# Patient Record
Sex: Male | Born: 1960 | Race: Black or African American | Hispanic: No | Marital: Single | Smoking: Never smoker
Health system: Southern US, Community
[De-identification: ages and names within clinical notes are randomized; demographics above are authoritative.]

## PROBLEM LIST (undated history)

## (undated) DIAGNOSIS — I10 Essential (primary) hypertension: Secondary | ICD-10-CM

## (undated) DIAGNOSIS — M1990 Unspecified osteoarthritis, unspecified site: Secondary | ICD-10-CM

---

## 2019-08-05 ENCOUNTER — Other Ambulatory Visit: Payer: Self-pay

## 2019-08-05 ENCOUNTER — Encounter (HOSPITAL_COMMUNITY): Payer: Self-pay

## 2019-08-05 ENCOUNTER — Emergency Department (HOSPITAL_COMMUNITY): Payer: No Typology Code available for payment source

## 2019-08-05 ENCOUNTER — Emergency Department (HOSPITAL_COMMUNITY)
Admission: EM | Admit: 2019-08-05 | Discharge: 2019-08-05 | Disposition: A | Discharge status: Home / Self Care | Payer: No Typology Code available for payment source | Attending: Emergency Medicine | Admitting: Emergency Medicine

## 2019-08-05 DIAGNOSIS — Y9389 Activity, other specified: Secondary | ICD-10-CM | POA: Diagnosis not present

## 2019-08-05 DIAGNOSIS — S199XXA Unspecified injury of neck, initial encounter: Secondary | ICD-10-CM | POA: Diagnosis present

## 2019-08-05 DIAGNOSIS — Y92410 Unspecified street and highway as the place of occurrence of the external cause: Secondary | ICD-10-CM | POA: Insufficient documentation

## 2019-08-05 DIAGNOSIS — I10 Essential (primary) hypertension: Secondary | ICD-10-CM | POA: Insufficient documentation

## 2019-08-05 DIAGNOSIS — S161XXA Strain of muscle, fascia and tendon at neck level, initial encounter: Secondary | ICD-10-CM

## 2019-08-05 DIAGNOSIS — Y999 Unspecified external cause status: Secondary | ICD-10-CM | POA: Insufficient documentation

## 2019-08-05 HISTORY — DX: Essential (primary) hypertension: I10

## 2019-08-05 HISTORY — DX: Unspecified osteoarthritis, unspecified site: M19.90

## 2019-08-05 MED ORDER — ACETAMINOPHEN 500 MG PO TABS: 1000.0000 mg | ORAL_TABLET | Freq: Once | ORAL | Status: DC
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
## 2019-08-05 NOTE — Discharge Instructions (Signed)
Please return for any problem.  Follow-up with your regular care provider as instructed. °

## 2019-11-06 ENCOUNTER — Ambulatory Visit: Payer: Self-pay | Admitting: Cardiology
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
## 2019-12-16 ENCOUNTER — Other Ambulatory Visit: Payer: Self-pay | Admitting: *Deleted

## 2019-12-21 ENCOUNTER — Other Ambulatory Visit: Payer: Self-pay

## 2019-12-21 ENCOUNTER — Encounter: Payer: Self-pay | Admitting: Cardiology

## 2019-12-21 ENCOUNTER — Ambulatory Visit (INDEPENDENT_AMBULATORY_CARE_PROVIDER_SITE_OTHER): Payer: BC Managed Care – PPO | Admitting: Cardiology

## 2019-12-21 DIAGNOSIS — R0683 Snoring: Secondary | ICD-10-CM | POA: Insufficient documentation

## 2019-12-21 DIAGNOSIS — R0600 Dyspnea, unspecified: Secondary | ICD-10-CM

## 2019-12-21 DIAGNOSIS — I10 Essential (primary) hypertension: Secondary | ICD-10-CM | POA: Diagnosis not present

## 2019-12-21 DIAGNOSIS — R0609 Other forms of dyspnea: Secondary | ICD-10-CM

## 2019-12-21 DIAGNOSIS — I447 Left bundle-branch block, unspecified: Secondary | ICD-10-CM

## 2019-12-21 NOTE — Progress Notes (Signed)
Cardiology Consultation:    Date:  12/21/2019   ID:  Barry Keller, DOB 02-Jul-1961, MRN [id number]  PCP:  Maylon Cos, NP  Cardiologist:  Jenne Campus, MD   Referring MD: Maylon Cos, NP   Chief Complaint  Patient presents with  . New Patient (Initial Visit)  I am not sure why I am here  History of Present Illness:    Barry Keller is a 59 y.o. male who is being seen today for the evaluation of left bundle branch block at the request of Maylon Cos, NP.  He does have past medical history significant for morbid obesity, essential hypertension.  Years ago he had some investigation done for some heart problem but he does not know exactly what was the issue he was referred to [place] because routine EKG showed left bundle branch block.  He denies having any shortness no chest pain tightness squeezing pressure burning chest he still work physically he described to have some shortness of breath with exertion.  No palpitations no dizziness no passing out.  He does not smoke he does have high blood pressure which appears to be still poorly controlled.  He does not have any way to check his blood pressure at home.  He does not remember what medication he takes and I do not have a list of this medication.  He remember taking metoprolol and some other water pill.  Past Medical History:  Diagnosis Date  . Arthritis   . Hypertension     History reviewed. No pertinent surgical history.  Current Medications: Current Meds  Medication Sig  . lisinopril (ZESTRIL) 40 MG tablet Take 40 mg by mouth daily.  . metoprolol tartrate (LOPRESSOR) 100 MG tablet Take 100 mg by mouth daily.     Allergies:   Other and Sulfa antibiotics   Social History   Socioeconomic History  . Marital status: Single    Spouse name: Not on file  . Number of children: Not on file  . Years of education: Not on file  . Highest education level: Not on file  Occupational History  . Not on file  Tobacco  Use  . Smoking status: Never Smoker  . Smokeless tobacco: Never Used  Substance and Sexual Activity  . Alcohol use: Never  . Drug use: Never  . Sexual activity: Not on file  Other Topics Concern  . Not on file  Social History Narrative  . Not on file   Social Determinants of Health   Financial Resource Strain:   . Difficulty of Paying Living Expenses: Not on file  Food Insecurity:   . Worried About Charity fundraiser in the Last Year: Not on file  . Ran Out of Food in the Last Year: Not on file  Transportation Needs:   . Lack of Transportation (Medical): Not on file  . Lack of Transportation (Non-Medical): Not on file  Physical Activity:   . Days of Exercise per Week: Not on file  . Minutes of Exercise per Session: Not on file  Stress:   . Feeling of Stress : Not on file  Social Connections:   . Frequency of Communication with Friends and Family: Not on file  . Frequency of Social Gatherings with Friends and Family: Not on file  . Attends Religious Services: Not on file  . Active Member of Clubs or Organizations: Not on file  . Attends Archivist Meetings: Not on file  . Marital Status: Not on file  Family History: The patient's family history includes Chronic Renal Failure in his mother; Hypertension in his mother. ROS:   Please see the history of present illness.    All 14 point review of systems negative except as described per history of present illness.  EKGs/Labs/Other Studies Reviewed:    The following studies were reviewed today: Normal sinus rhythm, normal P interval, left bundle branch block.    Recent Labs: No results found for requested labs within last 8760 hours.  Recent Lipid Panel No results found for: CHOL, TRIG, HDL, CHOLHDL, VLDL, LDLCALC, LDLDIRECT  Physical Exam:    VS:  BP (!) 188/110   Pulse 69   Ht 6' (1.829 m)   Wt (!) 314 lb (142.4 kg)   SpO2 95%   BMI 42.59 kg/m     Wt Readings from Last 3 Encounters:  12/21/19  (!) 314 lb (142.4 kg)  08/05/19 300 lb (136.1 kg)     GEN:  Well nourished, well developed in no acute distress HEENT: Normal NECK: No JVD; No carotid bruits LYMPHATICS: No lymphadenopathy CARDIAC: RRR, no murmurs, no rubs, no gallops RESPIRATORY:  Clear to auscultation without rales, wheezing or rhonchi  ABDOMEN: Soft, non-tender, non-distended MUSCULOSKELETAL:  No edema; No deformity  SKIN: Warm and dry NEUROLOGIC:  Alert and oriented x 3 PSYCHIATRIC:  Normal affect   ASSESSMENT:    1. Left bundle branch block   2. Essential hypertension   3. Dyspnea on exertion   4. Snoring    PLAN:    In order of problems listed above:  1. Left bundle branch block.  Incidental discovery.  As a part of evaluation I will schedule him to have an echocardiogram done to assess left ventricle ejection fraction.  Also Eugenie Birks will be done to rule out any significant ischemia. 2. Essential hypertension blood pressure elevated today.  But this is the first visit in our office on top of that he does not remember what medication he takes.  I asked him to call our office later today and tell us what medication he takes.  Also advised him to get blood pressure monitor and check blood pressure on the regular basis. 3. Dyspnea on exertion stress test and echocardiogram will be done 4. Snoring I suspect he does have sleep apnea however he tells me that many years ago he was tested for it and he was negative. 5. Obesity we had a discussion about this as well.  Will be beneficial for him to lose some weight.   Medication Adjustments/Labs and Tests Ordered: Current medicines are reviewed at length with the patient today.  Concerns regarding medicines are outlined above.  No orders of the defined types were placed in this encounter.  No orders of the defined types were placed in this encounter.   Signed, Georgeanna Lea, MD, [facility]. 12/21/2019 12:06 PM    [facility]

## 2019-12-21 NOTE — Addendum Note (Signed)
Addended by: Vanessa Zeeland R on: 12/21/2019 01:10 PM   Modules accepted: Orders

## 2019-12-21 NOTE — Patient Instructions (Signed)
Medication Instructions:  Your physician recommends that you continue on your current medications as directed. Please refer to the Current Medication list given to you today.  *If you need a refill on your cardiac medications before your next appointment, please call your pharmacy*  Lab Work: None  If you have labs (blood work) drawn today and your tests are completely normal, you will receive your results only by: . MyChart Message (if you have MyChart) OR . A paper copy in the mail If you have any lab test that is abnormal or we need to change your treatment, we will call you to review the results.  Testing/Procedures: Your physician has requested that you have an echocardiogram. Echocardiography is a painless test that uses sound waves to create images of your heart. It provides your doctor with information about the size and shape of your heart and how well your heart's chambers and valves are working. This procedure takes approximately one hour. There are no restrictions for this procedure.  Your physician has requested that you have a lexiscan myoview. For further information please visit [email or web address]. Please follow instruction sheet, as given.    Follow-Up: At [facility], you and your health needs are our priority.  As part of our continuing mission to provide you with exceptional heart care, we have created designated Provider Care Teams.  These Care Teams include your primary Cardiologist (physician) and Advanced Practice Providers (APPs -  Physician Assistants and Nurse Practitioners) who all work together to provide you with the care you need, when you need it.  Your next appointment:   6 week(s)  The format for your next appointment:   In Person  Provider:   Robert Krasowski, MD  Other Instructions   Echocardiogram An echocardiogram is a procedure that uses painless sound waves (ultrasound) to produce an image of the heart. Images from an echocardiogram can  provide important information about:  Signs of coronary artery disease (CAD).  Aneurysm detection. An aneurysm is a weak or damaged part of an artery wall that bulges out from the normal force of blood pumping through the body.  Heart size and shape. Changes in the size or shape of the heart can be associated with certain conditions, including heart failure, aneurysm, and CAD.  Heart muscle function.  Heart valve function.  Signs of a past heart attack.  Fluid buildup around the heart.  Thickening of the heart muscle.  A tumor or infectious growth around the heart valves. Tell a health care provider about:  Any allergies you have.  All medicines you are taking, including vitamins, herbs, eye drops, creams, and over-the-counter medicines.  Any blood disorders you have.  Any surgeries you have had.  Any medical conditions you have.  Whether you are pregnant or may be pregnant. What are the risks? Generally, this is a safe procedure. However, problems may occur, including:  Allergic reaction to dye (contrast) that may be used during the procedure. What happens before the procedure? No specific preparation is needed. You may eat and drink normally. What happens during the procedure?   An IV tube may be inserted into one of your veins.  You may receive contrast through this tube. A contrast is an injection that improves the quality of the pictures from your heart.  A gel will be applied to your chest.  A wand-like tool (transducer) will be moved over your chest. The gel will help to transmit the sound waves from the transducer.  The sound waves   will harmlessly bounce off of your heart to allow the heart images to be captured in real-time motion. The images will be recorded on a computer. The procedure may vary among health care providers and hospitals. What happens after the procedure?  You may return to your normal, everyday life, including diet, activities, and  medicines, unless your health care provider tells you not to do that. Summary  An echocardiogram is a procedure that uses painless sound waves (ultrasound) to produce an image of the heart.  Images from an echocardiogram can provide important information about the size and shape of your heart, heart muscle function, heart valve function, and fluid buildup around your heart.  You do not need to do anything to prepare before this procedure. You may eat and drink normally.  After the echocardiogram is completed, you may return to your normal, everyday life, unless your health care provider tells you not to do that. This information is not intended to replace advice given to you by your health care provider. Make sure you discuss any questions you have with your health care provider. Document Revised: 03/26/2019 Document Reviewed: 01/05/2017 Elsevier Patient Education  2020 Elsevier Inc.   Cardiac Nuclear Scan A cardiac nuclear scan is a test that measures blood flow to the heart when a person is resting and when he or she is exercising. The test looks for problems such as:  Not enough blood reaching a portion of the heart.  The heart muscle not working normally. You may need this test if:  You have heart disease.  You have had abnormal lab results.  You have had heart surgery or a balloon procedure to open up blocked arteries (angioplasty).  You have chest pain.  You have shortness of breath. In this test, a radioactive dye (tracer) is injected into your bloodstream. After the tracer has traveled to your heart, an imaging device is used to measure how much of the tracer is absorbed by or distributed to various areas of your heart. This procedure is usually done at a hospital and takes 2-4 hours. Tell a health care provider about:  Any allergies you have.  All medicines you are taking, including vitamins, herbs, eye drops, creams, and over-the-counter medicines.  Any problems you  or family members have had with anesthetic medicines.  Any blood disorders you have.  Any surgeries you have had.  Any medical conditions you have.  Whether you are pregnant or may be pregnant. What are the risks? Generally, this is a safe procedure. However, problems may occur, including:  Serious chest pain and heart attack. This is only a risk if the stress portion of the test is done.  Rapid heartbeat.  Sensation of warmth in your chest. This usually passes quickly.  Allergic reaction to the tracer. What happens before the procedure?  Ask your health care provider about changing or stopping your regular medicines. This is especially important if you are taking diabetes medicines or blood thinners.  Follow instructions from your health care provider about eating or drinking restrictions.  Remove your jewelry on the day of the procedure. What happens during the procedure?  An IV will be inserted into one of your veins.  Your health care provider will inject a small amount of radioactive tracer through the IV.  You will wait for 20-40 minutes while the tracer travels through your bloodstream.  Your heart activity will be monitored with an electrocardiogram (ECG).  You will lie down on an exam table.    Images of your heart will be taken for about 15-20 minutes.  You may also have a stress test. For this test, one of the following may be done: ? You will exercise on a treadmill or stationary bike. While you exercise, your heart's activity will be monitored with an ECG, and your blood pressure will be checked. ? You will be given medicines that will increase blood flow to parts of your heart. This is done if you are unable to exercise.  When blood flow to your heart has peaked, a tracer will again be injected through the IV.  After 20-40 minutes, you will get back on the exam table and have more images taken of your heart.  Depending on the type of tracer used, scans may  need to be repeated 3-4 hours later.  Your IV line will be removed when the procedure is over. The procedure may vary among health care providers and hospitals. What happens after the procedure?  Unless your health care provider tells you otherwise, you may return to your normal schedule, including diet, activities, and medicines.  Unless your health care provider tells you otherwise, you may increase your fluid intake. This will help to flush the contrast dye from your body. Drink enough fluid to keep your urine pale yellow.  Ask your health care provider, or the department that is doing the test: ? When will my results be ready? ? How will I get my results? Summary  A cardiac nuclear scan measures the blood flow to the heart when a person is resting and when he or she is exercising.  Tell your health care provider if you are pregnant.  Before the procedure, ask your health care provider about changing or stopping your regular medicines. This is especially important if you are taking diabetes medicines or blood thinners.  After the procedure, unless your health care provider tells you otherwise, increase your fluid intake. This will help flush the contrast dye from your body.  After the procedure, unless your health care provider tells you otherwise, you may return to your normal schedule, including diet, activities, and medicines. This information is not intended to replace advice given to you by your health care provider. Make sure you discuss any questions you have with your health care provider. Document Revised: 05/19/2018 Document Reviewed: 05/19/2018 Elsevier Patient Education  2020 Elsevier Inc.  

## 2020-01-20 ENCOUNTER — Telehealth: Payer: Self-pay | Admitting: *Deleted

## 2020-01-20 NOTE — Telephone Encounter (Signed)
Left message on voicemail per DPR in reference to upcoming appointment scheduled on 01/26/2020 at 1100 with detailed instructions given per Myocardial Perfusion Study Information Sheet for the test. LM to arrive 15 minutes early, and that it is imperative to arrive on time for appointment to keep from having the test rescheduled. If you need to cancel or reschedule your appointment, please call the office within 24 hours of your appointment. Failure to do so may result in a cancellation of your appointment, and a $50 no show fee. Phone number given for call back for any questions.   No mychart.Lynia Landry, Adelene Idler

## 2020-01-26 ENCOUNTER — Ambulatory Visit (INDEPENDENT_AMBULATORY_CARE_PROVIDER_SITE_OTHER): Payer: BC Managed Care – PPO

## 2020-01-26 ENCOUNTER — Other Ambulatory Visit: Payer: Self-pay

## 2020-01-26 VITALS — Ht 72.0 in | Wt 314.0 lb

## 2020-01-26 DIAGNOSIS — I447 Left bundle-branch block, unspecified: Secondary | ICD-10-CM

## 2020-01-26 DIAGNOSIS — I10 Essential (primary) hypertension: Secondary | ICD-10-CM

## 2020-01-26 DIAGNOSIS — R0609 Other forms of dyspnea: Secondary | ICD-10-CM

## 2020-01-26 DIAGNOSIS — R0600 Dyspnea, unspecified: Secondary | ICD-10-CM

## 2020-01-27 ENCOUNTER — Ambulatory Visit: Payer: BC Managed Care – PPO

## 2020-01-27 DIAGNOSIS — R0600 Dyspnea, unspecified: Secondary | ICD-10-CM

## 2020-01-27 DIAGNOSIS — I447 Left bundle-branch block, unspecified: Secondary | ICD-10-CM

## 2020-01-27 LAB — MYOCARDIAL PERFUSION IMAGING
LV dias vol: 136 mL (ref 62–150)
LV sys vol: 56 mL
Peak HR: 91 {beats}/min
Rest HR: 72 {beats}/min
SDS: 1
SRS: 4
SSS: 5
TID: 0.96

## 2020-01-27 MED ORDER — TECHNETIUM TC 99M TETROFOSMIN IV KIT
30.7000 | PACK | Freq: Once | INTRAVENOUS | Status: AC | PRN
  Administered 2020-01-27: 30.7 via INTRAVENOUS

## 2020-01-27 MED ORDER — REGADENOSON 0.4 MG/5ML IV SOLN
0.4000 mg | Freq: Once | INTRAVENOUS | Status: AC
  Administered 2020-01-27: 0.4 mg via INTRAVENOUS

## 2020-01-28 ENCOUNTER — Encounter (HOSPITAL_COMMUNITY): Payer: Self-pay | Admitting: *Deleted

## 2020-01-29 ENCOUNTER — Telehealth: Payer: Self-pay | Admitting: Cardiology

## 2020-01-29 NOTE — Telephone Encounter (Signed)
Follow Up:   Returning call, concerning his Stress Test results.

## 2020-02-01 NOTE — Telephone Encounter (Signed)
Left message with results on patients voicemail per dpr. Advised patient to call back with any questions.

## 2020-02-10 ENCOUNTER — Other Ambulatory Visit: Payer: Self-pay

## 2020-02-10 ENCOUNTER — Ambulatory Visit (INDEPENDENT_AMBULATORY_CARE_PROVIDER_SITE_OTHER): Payer: BC Managed Care – PPO

## 2020-02-10 DIAGNOSIS — R0600 Dyspnea, unspecified: Secondary | ICD-10-CM | POA: Diagnosis not present

## 2020-02-10 NOTE — Progress Notes (Signed)
Complete echocardiogram has been performed.  Jimmy Jailen Lung RDCS, RVT 

## 2020-02-12 ENCOUNTER — Emergency Department (HOSPITAL_BASED_OUTPATIENT_CLINIC_OR_DEPARTMENT_OTHER): Payer: Worker's Compensation

## 2020-02-12 ENCOUNTER — Emergency Department (HOSPITAL_BASED_OUTPATIENT_CLINIC_OR_DEPARTMENT_OTHER)
Admission: EM | Admit: 2020-02-12 | Discharge: 2020-02-12 | Disposition: A | Discharge status: Home / Self Care | Payer: Worker's Compensation | Attending: Emergency Medicine | Admitting: Emergency Medicine

## 2020-02-12 ENCOUNTER — Other Ambulatory Visit: Payer: Self-pay

## 2020-02-12 ENCOUNTER — Encounter (HOSPITAL_BASED_OUTPATIENT_CLINIC_OR_DEPARTMENT_OTHER): Payer: Self-pay | Admitting: Student

## 2020-02-12 DIAGNOSIS — M25531 Pain in right wrist: Secondary | ICD-10-CM | POA: Insufficient documentation

## 2020-02-12 DIAGNOSIS — Z79899 Other long term (current) drug therapy: Secondary | ICD-10-CM | POA: Diagnosis not present

## 2020-02-12 DIAGNOSIS — Z882 Allergy status to sulfonamides status: Secondary | ICD-10-CM | POA: Insufficient documentation

## 2020-02-12 DIAGNOSIS — I10 Essential (primary) hypertension: Secondary | ICD-10-CM | POA: Insufficient documentation

## 2020-02-12 MED ORDER — NAPROXEN 500 MG PO TABS: 500.0000 mg | ORAL_TABLET | Freq: Two times a day (BID) | ORAL | 0 refills | Status: AC

## 2020-02-12 NOTE — ED Provider Notes (Signed)
[facility] EMERGENCY DEPARTMENT Provider Note   CSN: [id number] Arrival date & time: 02/12/20  4196     History Chief Complaint  Patient presents with  . Wrist Injury    right    Barry Keller is a 59 y.o. male with a history of hypertension & arthritis who presents to the ED with complaints of right wrist pain s/p injury 1 week prior. Patient states he was working with a screw gun and had a quick jolt type movement to R wrist with subsequent onset of pain. Since injury pain has been constant, worse with certain movements, no alleviating factors. Denies fever, chills, numbness, tingling, or weakness. R hand dominant. History of similar injury years ago but healed more quickly.   HPI     Past Medical History:  Diagnosis Date  . Arthritis   . Hypertension     Patient Active Problem List   Diagnosis Date Noted  . Left bundle branch block 12/21/2019  . Essential hypertension 12/21/2019  . Dyspnea on exertion 12/21/2019  . Snoring 12/21/2019    No past surgical history on file.     Family History  Problem Relation Age of Onset  . Chronic Renal Failure Mother   . Hypertension Mother     Social History   Tobacco Use  . Smoking status: Never Smoker  . Smokeless tobacco: Never Used  Substance Use Topics  . Alcohol use: Never  . Drug use: Never    Home Medications Prior to Admission medications   Medication Sig Start Date End Date Taking? Authorizing Provider  lisinopril (ZESTRIL) 40 MG tablet Take 40 mg by mouth daily. 09/07/19   [provider]  metoprolol tartrate (LOPRESSOR) 100 MG tablet Take 100 mg by mouth daily. 09/21/19   [provider]    Allergies    Other and Sulfa antibiotics  Review of Systems   Review of Systems  Constitutional: Negative for chills and fever.  Eyes: Negative for visual disturbance.  Respiratory: Negative for shortness of breath.   Cardiovascular: Negative for chest pain.  Musculoskeletal: Positive  for arthralgias.  Skin: Negative for color change and wound.  Neurological: Negative for weakness, numbness and headaches.    Physical Exam Updated Vital Signs BP (!) 188/118 (BP Location: Left Arm)   Pulse 69   Temp 98.1 F (36.7 C) (Oral)   Resp 15   SpO2 100%   Physical Exam Vitals and nursing note reviewed.  Constitutional:      General: He is not in acute distress.    Appearance: Normal appearance. He is not ill-appearing or toxic-appearing.  HENT:     Head: Normocephalic and atraumatic.  Neck:     Comments: No midline tenderness.  Cardiovascular:     Rate and Rhythm: Normal rate.     Pulses:          Radial pulses are 2+ on the right side and 2+ on the left side.  Pulmonary:     Effort: No respiratory distress.     Breath sounds: Normal breath sounds.  Musculoskeletal:     Cervical back: Normal range of motion and neck supple.     Comments: Upper extremities: No obvious deformity, appreciable swelling, edema, erythema, ecchymosis, warmth, or open wounds. Patient has intact AROM throughout, some pain with R wrist flexion/extension, no pain with supination. Tender to palpation over the doral R wrist, otherwise nontender. No snuffbox tenderness.   Skin:    General: Skin is warm and dry.  Capillary Refill: Capillary refill takes less than 2 seconds.  Neurological:     Mental Status: He is alert.     Comments: Alert. Clear speech. Sensation grossly intact to bilateral upper extremities. 5/5 symmetric grip strength & strength with wrist flexion/extension. Ambulatory. Able to perform OK sign, thumbs up & cross 2nd/3rd digits bilaterally.   Psychiatric:        Mood and Affect: Mood normal.        Behavior: Behavior normal.     ED Results / Procedures / Treatments   Labs (all labs ordered are listed, but only abnormal results are displayed) Labs Reviewed - No data to display  EKG None  Radiology DG Wrist Complete Right  Result Date: 02/12/2020 CLINICAL DATA:   Right wrist pain after injury 1 week ago EXAM: RIGHT WRIST - COMPLETE 3+ VIEW COMPARISON:  None. FINDINGS: 5 mm ovoid osseous density at the level of the proximal carpal row near the lunate fossa of the distal radius which may reflect sequela of age-indeterminate trauma. Elsewhere, no evidence of acute fracture. No dislocation. Carpal bones and carpal intraosseous spaces are preserved. Mild degenerative changes of the first MCP joint. Soft tissues are unremarkable. IMPRESSION: 5 mm ovoid osseous density at the level of the proximal carpal row near the lunate fossa of the distal radius which may reflect sequela of age-indeterminate trauma. A CT could be performed if further imaging evaluation of this finding is clinically warranted. Electronically Signed   By: Duanne Guess D.O.   On: 02/12/2020 09:41   CT Wrist Right Wo Contrast  Result Date: 02/12/2020 CLINICAL DATA:  Wrist pain after injury using power tool 1 week ago. Painful range of motion. EXAM: CT OF THE RIGHT WRIST WITHOUT CONTRAST TECHNIQUE: Multidetector CT imaging of the right wrist was performed according to the standard protocol. Multiplanar CT image reconstructions were also generated. COMPARISON:  Radiographs 02/12/2020. FINDINGS: Bones/Joint/Cartilage Well corticated 5 mm ossific fragment along the dorsal and ulnar aspect the distal radius is likely a fragmented osteophyte or sequela of remote trauma. No evidence of acute fracture or dislocation. The carpal bone alignment is normal. Mild degenerative changes are present at the 1st carpometacarpal articulation. There is a mild ulnar minus variance at the wrist. Ligaments Suboptimally assessed by CT. Muscles and Tendons Unremarkable. Soft tissues Unremarkable. IMPRESSION: 1. No evidence of acute fracture or dislocation. 2. Well corticated 5 mm ossific fragment along the dorsal and ulnar aspect of the distal radius, likely a fragmented osteophyte or sequela of remote trauma. 3. Mild degenerative  changes at the 1st carpometacarpal articulation. Electronically Signed   By: Carey Bullocks M.D.   On: 02/12/2020 10:44    Procedures Procedures (including critical care time)  Medications Ordered in ED Medications - No data to display  ED Course  I have reviewed the triage vital signs and the nursing notes.  Pertinent labs & imaging results that were available during my care of the patient were reviewed by me and considered in my medical decision making (see chart for details).    MDM Rules/Calculators/A&P                      Patient presents to the ED with complaints of R wrist pain s/p twisting injury with screw gun 1 week prior. Nontoxic, vitals WNL with the exception of elevated BP- consistent w/ prior BP readings on chart review- doubt HTN emergency. No signs of infection, not consistent w/ septic joint. Xray with 5 mm  ovoid osseous density at the level of the proximal carpal row near the lunate fossa of the distal radius which may reflect sequela of age-indeterminate trauma. --> CT obtained for further delineation ->  1. No evidence of acute fracture or dislocation. 2. Well corticated 5 mm ossific fragment along the dorsal and ulnar aspect of the distal radius, likely a fragmented osteophyte or sequela of remote trauma. 3. Mild degenerative changes at the 1st carpometacarpal articulation  Will place in wrist brace, provide short course of naproxen (last creatinine 0.94 on chart review) with sports medicine follow up. I discussed results, treatment plan, need for follow-up, and return precautions with the patient. Provided opportunity for questions, patient confirmed understanding and is in agreement with plan.   Findings and plan of care discussed with supervising physician Dr. Lockie Mola who is in agreement.    Final Clinical Impression(s) / ED Diagnoses Final diagnoses:  Right wrist pain    Rx / DC Orders ED Discharge Orders         Ordered    naproxen (NAPROSYN) 500 MG  tablet  2 times daily     02/12/20 [address], [place] R, PA-C 02/12/20 1101    [place], DO 02/12/20 1217

## 2020-02-12 NOTE — Discharge Instructions (Addendum)
Please read and follow all provided instructions.  You have been seen today for right wrist pain.   Tests performed today include: An x-ray of the affected area as well as a CT scan do NOT show any broken bones or dislocations, there is an area of possible old injury or an osteophyte as we discussed.  Vital signs. See below for your results today.   Home care instructions: -- *PRICE in the first 24-48 hours.  Protect (with brace, splint, sling), if given by your provider Rest Ice- Do not apply ice pack directly to your skin, place towel or similar between your skin and ice/ice pack. Apply ice for 20 min, then remove for 40 min while awake Compression- Wear brace, elastic bandage, splint as directed by your provider Elevate affected extremity above the level of your heart when not walking around for the first 24-48 hours   Medications:  - Naproxen is a nonsteroidal anti-inflammatory medication that will help with pain and swelling. Be sure to take this medication as prescribed with food, 1 pill every 12 hours,  It should be taken with food, as it can cause stomach upset, and more seriously, stomach bleeding. Do not take other nonsteroidal anti-inflammatory medications with this such as Advil, Motrin, Aleve, Mobic, Goodie Powder, or Motrin.    You make take Tylenol per over the counter dosing with these medications.   We have prescribed you new medication(s) today. Discuss the medications prescribed today with your pharmacist as they can have adverse effects and interactions with your other medicines including over the counter and prescribed medications. Seek medical evaluation if you start to experience new or abnormal symptoms after taking one of these medicines, seek care immediately if you start to experience difficulty breathing, feeling of your throat closing, facial swelling, or rash as these could be indications of a more serious allergic reaction   Follow-up instructions: Please  follow-up with the sports medicine provider provided in your discharge instructions within 1 week.   Return instructions:  Please return if your digits or extremity are numb or tingling, appear gray or blue, or you have severe pain (also elevate the extremity and loosen splint or wrap if you were given one) Please return if you have redness or fevers.  Please return to the Emergency Department if you experience worsening symptoms.  Please return if you have any other emergent concerns. Additional Information:  Your vital signs today were: BP (!) 188/118 (BP Location: Left Arm)   Pulse 69   Temp 98.1 F (36.7 C) (Oral)   Resp 15   Ht 6' (1.829 m)   Wt 136.1 kg   SpO2 100%   BMI 40.69 kg/m  If your blood pressure (BP) was elevated above 135/85 this visit, please have this repeated by your doctor within one month. ---------------

## 2020-02-12 NOTE — ED Triage Notes (Signed)
Reports right wrist injury while working with a screw-gun. Painful ROM.

## 2020-03-03 ENCOUNTER — Encounter: Payer: Self-pay | Admitting: *Deleted
